# Patient Record
Sex: Female | Born: 1980 | Race: White | Hispanic: Yes | Marital: Married | Smoking: Never smoker
Health system: Southern US, Community
[De-identification: ages and names within clinical notes are randomized; demographics above are authoritative.]

## PROBLEM LIST (undated history)

## (undated) HISTORY — PX: BREAST SURGERY: SHX581

---

## 1999-07-07 ENCOUNTER — Ambulatory Visit (HOSPITAL_COMMUNITY): Admission: RE | Admit: 1999-07-07 | Discharge: 1999-07-07 | Payer: Self-pay | Admitting: Obstetrics

## 1999-09-08 ENCOUNTER — Ambulatory Visit (HOSPITAL_COMMUNITY): Admission: RE | Admit: 1999-09-08 | Discharge: 1999-09-08 | Payer: Self-pay | Admitting: *Deleted

## 1999-09-13 ENCOUNTER — Encounter (HOSPITAL_COMMUNITY): Admission: RE | Admit: 1999-09-13 | Discharge: 1999-11-24 | Payer: Self-pay | Admitting: Obstetrics & Gynecology

## 1999-10-05 ENCOUNTER — Encounter: Payer: Self-pay | Admitting: Obstetrics & Gynecology

## 1999-10-25 ENCOUNTER — Encounter: Payer: Self-pay | Admitting: Obstetrics & Gynecology

## 1999-11-08 ENCOUNTER — Encounter: Payer: Self-pay | Admitting: Obstetrics & Gynecology

## 1999-11-23 ENCOUNTER — Inpatient Hospital Stay (HOSPITAL_COMMUNITY): Admission: AD | Admit: 1999-11-23 | Discharge: 1999-11-29 | Payer: Self-pay | Admitting: *Deleted

## 1999-11-26 ENCOUNTER — Encounter: Payer: Self-pay | Admitting: Obstetrics & Gynecology

## 2001-03-29 ENCOUNTER — Encounter (HOSPITAL_BASED_OUTPATIENT_CLINIC_OR_DEPARTMENT_OTHER): Payer: Self-pay | Admitting: General Surgery

## 2001-03-29 ENCOUNTER — Encounter: Admission: RE | Admit: 2001-03-29 | Discharge: 2001-03-29 | Payer: Self-pay | Admitting: General Surgery

## 2001-04-27 ENCOUNTER — Encounter (HOSPITAL_BASED_OUTPATIENT_CLINIC_OR_DEPARTMENT_OTHER): Payer: Self-pay | Admitting: General Surgery

## 2001-05-01 ENCOUNTER — Encounter (INDEPENDENT_AMBULATORY_CARE_PROVIDER_SITE_OTHER): Payer: Self-pay | Admitting: *Deleted

## 2001-05-01 ENCOUNTER — Ambulatory Visit (HOSPITAL_COMMUNITY): Admission: RE | Admit: 2001-05-01 | Discharge: 2001-05-01 | Payer: Self-pay | Admitting: General Surgery

## 2003-08-15 ENCOUNTER — Other Ambulatory Visit: Admission: RE | Admit: 2003-08-15 | Discharge: 2003-08-15 | Payer: Self-pay | Admitting: Gynecology

## 2004-01-06 ENCOUNTER — Inpatient Hospital Stay (HOSPITAL_COMMUNITY): Admission: AD | Admit: 2004-01-06 | Discharge: 2004-01-08 | Payer: Self-pay | Admitting: Gynecology

## 2004-01-06 ENCOUNTER — Encounter (INDEPENDENT_AMBULATORY_CARE_PROVIDER_SITE_OTHER): Payer: Self-pay | Admitting: Specialist

## 2004-02-20 ENCOUNTER — Other Ambulatory Visit: Admission: RE | Admit: 2004-02-20 | Discharge: 2004-02-20 | Payer: Self-pay | Admitting: Gynecology

## 2008-05-14 ENCOUNTER — Ambulatory Visit: Payer: Self-pay | Admitting: Family Medicine

## 2008-06-10 ENCOUNTER — Ambulatory Visit: Payer: Self-pay | Admitting: Internal Medicine

## 2008-06-10 ENCOUNTER — Encounter: Payer: Self-pay | Admitting: Family Medicine

## 2008-06-10 LAB — CONVERTED CEMR LAB
ALT: 20 units/L (ref 0–35)
AST: 16 units/L (ref 0–37)
Albumin: 4.5 g/dL (ref 3.5–5.2)
Alkaline Phosphatase: 69 units/L (ref 39–117)
BUN: 9 mg/dL (ref 6–23)
Basophils Absolute: 0 10*3/uL (ref 0.0–0.1)
Basophils Relative: 1 % (ref 0–1)
CO2: 24 meq/L (ref 19–32)
Calcium: 9.4 mg/dL (ref 8.4–10.5)
Chloride: 106 meq/L (ref 96–112)
Cholesterol: 188 mg/dL (ref 0–200)
Creatinine, Ser: 0.46 mg/dL (ref 0.40–1.20)
Eosinophils Absolute: 0.2 10*3/uL (ref 0.0–0.7)
Eosinophils Relative: 3 % (ref 0–5)
FSH: 5.4 milliintl units/mL
Glucose, Bld: 89 mg/dL (ref 70–99)
HCT: 43.6 % (ref 36.0–46.0)
HDL: 57 mg/dL (ref >39)
Hemoglobin: 14.3 g/dL (ref 12.0–15.0)
LDL Cholesterol: 110 mg/dL — ABNORMAL HIGH (ref 0–99)
Lymphocytes Relative: 37 % (ref 12–46)
Lymphs Abs: 2.3 10*3/uL (ref 0.7–4.0)
MCHC: 32.8 g/dL (ref 30.0–36.0)
MCV: 90.1 fL (ref 78.0–100.0)
Monocytes Absolute: 0.4 10*3/uL (ref 0.1–1.0)
Monocytes Relative: 7 % (ref 3–12)
Neutro Abs: 3.2 10*3/uL (ref 1.7–7.7)
Neutrophils Relative %: 52 % (ref 43–77)
Platelets: 260 10*3/uL (ref 150–400)
Potassium: 4.2 meq/L (ref 3.5–5.3)
RBC: 4.84 M/uL (ref 3.87–5.11)
RDW: 13.1 % (ref 11.5–15.5)
Sodium: 140 meq/L (ref 135–145)
TSH: 0.527 microintl units/mL (ref 0.350–4.50)
Total Bilirubin: 0.7 mg/dL (ref 0.3–1.2)
Total CHOL/HDL Ratio: 3.3
Total Protein: 7 g/dL (ref 6.0–8.3)
Triglycerides: 106 mg/dL (ref <150)
VLDL: 21 mg/dL (ref 0–40)
WBC: 6.1 10*3/uL (ref 4.0–10.5)

## 2009-01-23 ENCOUNTER — Ambulatory Visit: Payer: Self-pay | Admitting: Internal Medicine

## 2009-01-26 ENCOUNTER — Ambulatory Visit: Payer: Self-pay | Admitting: *Deleted

## 2009-03-09 ENCOUNTER — Ambulatory Visit: Payer: Self-pay | Admitting: Internal Medicine

## 2009-03-09 ENCOUNTER — Encounter: Payer: Self-pay | Admitting: Family Medicine

## 2009-04-14 ENCOUNTER — Ambulatory Visit: Payer: Self-pay | Admitting: Internal Medicine

## 2010-04-02 ENCOUNTER — Ambulatory Visit: Payer: Self-pay | Admitting: Internal Medicine

## 2011-02-18 NOTE — Op Note (Signed)
[facility]  Patient:    Julia Collier                     MRN: [id number] Proc. Date: 05/01/01 Adm. Date:  [id number] Disc. Date: [id number] Attending:  Sonda Primes                           Operative Report  PREOPERATIVE DIAGNOSIS:  Right breast mass, rule out carcinoma.  POSTOPERATIVE DIAGNOSIS:  Right breast mass, rule out carcinoma, pathology pending.  OPERATION PERFORMED:  Excisional biopsy, right breast mass.  SURGEON:  Mardene Celeste. Lurene Shadow, M.D.  ASSISTANT:  Nurse.  ANESTHESIA:  General.  INDICATIONS FOR PROCEDURE:  The patient is a 30 year old Hispanic woman who presents with a mass in the right breast which on ultrasound shows an irregular and poorly circumscribed mass.  She is brought to the operating room now for excisional biopsy to rule out carcinoma.  DESCRIPTION OF PROCEDURE:  Following the induction of general anesthesia with the patient positioned supinely, the right breast was prepped and draped to be included in the sterile operative field.  The mass was located in the lower outer quadrant approximately midway between the areolar border and the inferior mammary line.  I chose to do an inferior mammary incision and deepened this through the skin and subcutaneous tissue carrying the dissection superiorly and medially toward the mass.  The mass was then grasped and sharply dissected free, removed and forwarded for pathologic evaluation. Hemostasis was obtained with electrocautery.  Breast tissues reapproximated with 2-0 Vicryls, subcutaneous tissues closed with 3-0 Vicryl interrupted sutures after sponge, instrument and sharp counts were verified.  Skin closed with 5-0 Monocryl and reinforced wiht Steri-Strips and sterile dressing applied.  Anesthetic reversed.  Patient removed from the operating room to the recovery room in stable condition having tolerated the procedure well. DD:  05/01/01 TD:  05/01/01 Job:  [id number]

## 2011-02-18 NOTE — Discharge Summary (Signed)
Julia Collier, Julia Collier                         ACCOUNT NO.:  [id number]   MEDICAL RECORD NO.:  [id number]                   PATIENT TYPE:  INP   LOCATION:  9137                                 FACILITY:  [facility]   PHYSICIAN:  Ivor Costa. Farrel Gobble, M.D.              DATE OF BIRTH:  04-19-1981   DATE OF ADMISSION:  01/06/2004  DATE OF DISCHARGE:  01/08/2004                                 DISCHARGE SUMMARY   DISCHARGE DIAGNOSES:  1. Intrauterine pregnancy at 39 weeks, delivered.  2. Positive group B streptococcus.  3. Status post spontaneous vaginal delivery.   HISTORY:  A 22-years-of-age gravida 2 para 1 with an EDC by ultrasound of  January 29, 2004.  Prenatal course had been complicated by history of  oligohydramnios with her first pregnancy delivered at 36 weeks, with  positive group B strep.  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   HOSPITAL COURSE:  On January 06, 2004 the patient presented at 39+ weeks in  prodromal labor.  Labor was augmented with Pitocin.  Artificial rupture of  membranes was performed and subsequently on January 06, 2004 the patient  underwent a spontaneous vaginal delivery of a female, Apgars of 9 and 9,  weight 8 pounds 6 ounces.  There was no episiotomy or laceration.  It was  noted to be increased bleeding initially which was treated with massage and  increased Pitocin which resolved the problem.  Postpartum the patient  remained afebrile, voiding, in stable condition, and she was discharged to  home on January 08, 2004 and given [facility] postpartum  instructions and postpartum booklet.   ACCESSORY CLINICAL FINDINGS/LABORATORY DATA:  The patient is O positive,  rubella immune.  On January 07, 2004 hemoglobin 11.3.   DISPOSITION:  The patient is discharged to home, informed to follow up in 6  weeks, if had any problem  prior to that time to be seen in the office.     Susa Loffler, P.A.                    Ivor Costa. Farrel Gobble, M.D.    TSG/MEDQ  D:  01/19/2004  T:  01/19/2004  Job:  [id number]

## 2011-10-24 ENCOUNTER — Other Ambulatory Visit: Payer: Self-pay | Admitting: Family Medicine

## 2013-08-27 ENCOUNTER — Ambulatory Visit (INDEPENDENT_AMBULATORY_CARE_PROVIDER_SITE_OTHER): Payer: Self-pay | Admitting: Emergency Medicine

## 2013-08-27 VITALS — BP 122/70 | HR 78 | Temp 99.2°F | Resp 16 | Ht 62.0 in | Wt 141.0 lb

## 2013-08-27 DIAGNOSIS — N644 Mastodynia: Secondary | ICD-10-CM

## 2013-08-27 NOTE — Patient Instructions (Signed)
If you are not contacted with a time for your ultrasound call back and we will confirm the time, however you should receive a call with an appointment time in the next 24 hours.

## 2013-08-27 NOTE — Progress Notes (Addendum)
  Subjective:    Patient ID: Julia Collier, female    DOB: 15-Nov-1980, 32 y.o.   MRN: [id number]  HPI 32 yo female with complaint of left breast mass/lump that is tender.  Onset over past 2 weeks.  No fever.  No injury.  Not nursing.  She has not taken anything for this at present.  Pain on left upper outer breast.  PPMH:  Right breast abscess which was drained in 2002.  SH:  Non smoker.  Review of Systems  Constitutional: Negative for fever and chills.  Respiratory: Negative for cough and shortness of breath.   Cardiovascular: Negative for chest pain and palpitations.  Gastrointestinal: Negative for nausea, vomiting, abdominal pain, diarrhea and constipation.  Genitourinary: Negative for dysuria and frequency.  Skin: Negative for color change and rash.  Neurological: Negative for light-headedness and headaches.       Objective:   Physical Exam Blood pressure 122/70, pulse 78, temperature 99.2 F (37.3 C), temperature source Oral, resp. rate 16, height 5\' 2"  (1.575 m), weight 141 lb (63.957 kg), last menstrual period 07/31/2013, SpO2 99.00%. Body mass index is 25.78 kg/(m^2). Well-developed, well nourished female who is awake, alert and oriented, in NAD. HEENT: [place]/AT, PERRL, EOMI.  Sclera and conjunctiva are clear. OP is clear. Neck: supple, non-tender, no lymphadenopathy, thyromegaly. Heart: RRR, no murmur Lungs: normal effort, CTA Breat exam:  Mildly tender small firm lump in upper outer quadrant of left breast.  No erythema or fluctuance.  No overlying skin changes.  No axillary adenopathy palpable. Abdomen: normo-active bowel sounds, supple, non-tender, no mass or organomegaly. Extremities: no cyanosis, clubbing or edema. Skin: warm and dry without rash. Psychologic: good mood and appropriate affect, normal speech and behavior.       Assessment & Plan:  Breast mass vs. Cyst. [place] ordered and will follow up on the results.   I have reviewed and agree with  documentation. Robert P. Merla Riches, M.D.

## 2013-09-04 ENCOUNTER — Telehealth: Payer: Self-pay

## 2013-09-04 NOTE — Telephone Encounter (Signed)
Patient called to ask about [place] referral. Don't see any notes in chart about appointment. It has been several days and patient is anxious to get it scheduled. Please call back as soon as possible (per patient). Spanish speaking only.

## 2013-09-05 NOTE — Telephone Encounter (Signed)
Can you check with the breast center on the status of the U/S?

## 2013-09-23 ENCOUNTER — Other Ambulatory Visit (HOSPITAL_COMMUNITY): Payer: Self-pay | Admitting: *Deleted

## 2013-09-23 DIAGNOSIS — N6320 Unspecified lump in the left breast, unspecified quadrant: Secondary | ICD-10-CM

## 2013-10-01 ENCOUNTER — Ambulatory Visit (HOSPITAL_COMMUNITY)
Admission: RE | Admit: 2013-10-01 | Discharge: 2013-10-01 | Disposition: A | Discharge status: Home / Self Care | Payer: Medicaid Other | Source: Ambulatory Visit | Attending: Obstetrics and Gynecology | Admitting: Obstetrics and Gynecology

## 2013-10-01 ENCOUNTER — Encounter (HOSPITAL_COMMUNITY): Payer: Self-pay

## 2013-10-01 ENCOUNTER — Encounter (INDEPENDENT_AMBULATORY_CARE_PROVIDER_SITE_OTHER): Payer: Self-pay

## 2013-10-01 VITALS — BP 110/68 | Temp 98.0°F | Ht 62.0 in | Wt 143.2 lb

## 2013-10-01 DIAGNOSIS — Z1239 Encounter for other screening for malignant neoplasm of breast: Secondary | ICD-10-CM

## 2013-10-01 DIAGNOSIS — N6321 Unspecified lump in the left breast, upper outer quadrant: Secondary | ICD-10-CM

## 2013-10-01 DIAGNOSIS — N6315 Unspecified lump in the right breast, overlapping quadrants: Secondary | ICD-10-CM | POA: Insufficient documentation

## 2013-10-01 NOTE — Progress Notes (Signed)
Complaints of left breast lump x 4 months that is painful. Patient rated pain at a 8 out of 10.  Pap Smear:  Pap smear not completed today. Last Pap smear was 10/24/2011 at [facility] and normal. Per patient has no history of an abnormal Pap smear. Last Pap smear result is in EPIC.  Physical exam: Breasts Breasts symmetrical. No skin abnormalities bilateral breasts. No nipple retraction bilateral breasts. No nipple discharge bilateral breasts. No lymphadenopathy. Palpated a lump within the left breast at 2 o'clock 4 cm from the nipple. Palpated a lump within the right breast at 6 o'clock 3 cm from the nipple. Patient complained of tenderness when palpated both lumps. Referred patient to the [facility] for bilateral breast ultrasounds. Appointment scheduled for Friday, October 04, 2013 at 1330. Patient is currently around [redacted] weeks pregnant per patient.      Pelvic/Bimanual No Pap smear completed today since last Pap smear was 10/24/2011. Pap smear not indicated per BCCCP guidelines.

## 2013-10-01 NOTE — Patient Instructions (Signed)
Taught Julia Collier how to perform BSE and gave educational materials to take home. Patient did not need a Pap smear today due to last Pap smear was 10/24/2011. Let her know BCCCP will cover Pap smears every 3 years unless has a history of abnormal Pap smears. Referred patient to the [facility] for bilateral breast ultrasounds. Appointment scheduled for Friday, October 04, 2013 at 1330. Patient aware of appointment and will be there. Julia Collier verbalized understanding.  Lamyra Malcolm, Kathaleen Maser, RN 11:37 AM

## 2013-10-02 ENCOUNTER — Other Ambulatory Visit (HOSPITAL_COMMUNITY): Payer: Self-pay | Admitting: *Deleted

## 2013-10-02 DIAGNOSIS — N6310 Unspecified lump in the right breast, unspecified quadrant: Secondary | ICD-10-CM

## 2013-10-02 DIAGNOSIS — N6452 Nipple discharge: Secondary | ICD-10-CM

## 2013-10-03 NOTE — L&D Delivery Note (Signed)
Delivery Note  PRE-OPERATIVE DIAGNOSIS:  1) 4050w5d pregnancy.   POST-OPERATIVE DIAGNOSIS:  1) 8550w5d pregnancy s/p Vaginal, Spontaneous Delivery   Delivery Type: Vaginal, Spontaneous Delivery   Delivery Clinician: Grayson Pfefferle   Delivery Anesthesia: Epidural   Labor Complications: None  Lacerations: None  ESTIMATED BLOOD LOSS:  350 cc  Labor course: This is a 33 y.o. y.o. female 703P2002  who came in at 2450w5d pregnancy complaining of LOF.  Her prenatal course was complicated by none.  Initial cervical exam was 2/50/-3.  She was admitted to L and D.  Labor course included:  Starting on Pitocin Epidural placement   Procedure: Vaginal, Spontaneous Delivery    Date of birth: 05/13/2014   Time of birth: 5:28 AM    This [id number] woman under epidural anesthesia delivered a viable female  infant with Apgars as listed below.  Delivery was via NSVD.  Delivery completed and cord cut and clamped. Infant dried and stimulated. Baby to maternal abdomoen. Cord pH not obtained. Active management of the third stage of labor performed. Intact placenta delivered spontaneously at 8/11  5:33 AM . Vagina and cervix explored and no lacerations noted. Uterus well contracted at end of delivery.  Mother and infant tolerated delivery well.    FINDINGS:   1) female infant, Apgar scores of   at 1 minute   at 5 minutes   2) 3 Vessel Cord  3) Nuchal: no  SPECIMENS: Placenta Discarded; Cord gases not sent  COMPLICATIONS: none  DISPOSITION:  Infant to NBN

## 2013-10-04 ENCOUNTER — Ambulatory Visit
Admission: RE | Admit: 2013-10-04 | Discharge: 2013-10-04 | Disposition: A | Discharge status: Home / Self Care | Payer: No Typology Code available for payment source | Source: Ambulatory Visit | Attending: Obstetrics and Gynecology | Admitting: Obstetrics and Gynecology

## 2013-10-04 ENCOUNTER — Other Ambulatory Visit (HOSPITAL_COMMUNITY): Payer: Self-pay | Admitting: Obstetrics and Gynecology

## 2013-10-04 DIAGNOSIS — N6320 Unspecified lump in the left breast, unspecified quadrant: Secondary | ICD-10-CM

## 2013-11-07 ENCOUNTER — Other Ambulatory Visit (HOSPITAL_COMMUNITY): Payer: Self-pay | Admitting: Nurse Practitioner

## 2013-11-07 DIAGNOSIS — Z3689 Encounter for other specified antenatal screening: Secondary | ICD-10-CM

## 2013-12-19 ENCOUNTER — Ambulatory Visit (HOSPITAL_COMMUNITY)
Admission: RE | Admit: 2013-12-19 | Discharge: 2013-12-19 | Disposition: A | Discharge status: Home / Self Care | Payer: Medicaid Other | Source: Ambulatory Visit | Attending: Nurse Practitioner | Admitting: Nurse Practitioner

## 2013-12-19 DIAGNOSIS — Z3689 Encounter for other specified antenatal screening: Secondary | ICD-10-CM | POA: Insufficient documentation

## 2014-01-10 LAB — OB RESULTS CONSOLE GC/CHLAMYDIA
Chlamydia: NEGATIVE
Gonorrhea: NEGATIVE

## 2014-01-10 LAB — OB RESULTS CONSOLE HEPATITIS B SURFACE ANTIGEN: Hepatitis B Surface Ag: NEGATIVE

## 2014-04-14 LAB — OB RESULTS CONSOLE GBS: STREP GROUP B AG: NEGATIVE

## 2014-05-12 ENCOUNTER — Inpatient Hospital Stay (HOSPITAL_COMMUNITY)
Admission: AD | Admit: 2014-05-12 | Discharge: 2014-05-14 | DRG: 775 | Disposition: A | Discharge status: Home / Self Care | Payer: Medicaid Other | Source: Ambulatory Visit | Attending: Obstetrics & Gynecology | Admitting: Obstetrics & Gynecology

## 2014-05-12 ENCOUNTER — Encounter (HOSPITAL_COMMUNITY): Payer: Self-pay | Admitting: *Deleted

## 2014-05-12 DIAGNOSIS — Z833 Family history of diabetes mellitus: Secondary | ICD-10-CM

## 2014-05-12 DIAGNOSIS — O4290 Premature rupture of membranes, unspecified as to length of time between rupture and onset of labor, unspecified weeks of gestation: Principal | ICD-10-CM | POA: Diagnosis present

## 2014-05-12 LAB — OB RESULTS CONSOLE ABO/RH: RH Type: POSITIVE

## 2014-05-12 LAB — RPR

## 2014-05-12 LAB — CBC
HEMATOCRIT: 39.3 % (ref 36.0–46.0)
HEMOGLOBIN: 14.2 g/dL (ref 12.0–15.0)
MCH: 33.2 pg (ref 26.0–34.0)
MCHC: 36.1 g/dL — ABNORMAL HIGH (ref 30.0–36.0)
MCV: 91.8 fL (ref 78.0–100.0)
Platelets: 193 10*3/uL (ref 150–400)
RBC: 4.28 MIL/uL (ref 3.87–5.11)
RDW: 12.7 % (ref 11.5–15.5)
WBC: 8.6 10*3/uL (ref 4.0–10.5)

## 2014-05-12 LAB — OB RESULTS CONSOLE ANTIBODY SCREEN: Antibody Screen: NEGATIVE

## 2014-05-12 LAB — OB RESULTS CONSOLE RUBELLA ANTIBODY, IGM: Rubella: IMMUNE

## 2014-05-12 LAB — AMNISURE RUPTURE OF MEMBRANE (ROM) NOT AT ARMC: Amnisure ROM: POSITIVE

## 2014-05-12 LAB — OB RESULTS CONSOLE RPR: RPR: NONREACTIVE

## 2014-05-12 LAB — OB RESULTS CONSOLE HIV ANTIBODY (ROUTINE TESTING): HIV: NONREACTIVE

## 2014-05-12 LAB — TYPE AND SCREEN
ABO/RH(D): O POS
ANTIBODY SCREEN: NEGATIVE

## 2014-05-12 LAB — ABO/RH: ABO/RH(D): O POS

## 2014-05-12 MED ORDER — LACTATED RINGERS IV SOLN
INTRAVENOUS | Status: DC
  Administered 2014-05-12: 15:00:00 via INTRAVENOUS

## 2014-05-12 MED ORDER — PHENYLEPHRINE 40 MCG/ML (10ML) SYRINGE FOR IV PUSH (FOR BLOOD PRESSURE SUPPORT)
80.0000 ug | PREFILLED_SYRINGE | INTRAVENOUS | Status: DC | PRN
  Filled 2014-05-12: qty 2

## 2014-05-12 MED ORDER — OXYTOCIN 40 UNITS IN LACTATED RINGERS INFUSION - SIMPLE MED: 62.5000 mL/h | INTRAVENOUS | Status: DC

## 2014-05-12 MED ORDER — LACTATED RINGERS IV SOLN
INTRAVENOUS | Status: DC
  Administered 2014-05-12: 21:00:00 via INTRAVENOUS

## 2014-05-12 MED ORDER — LACTATED RINGERS IV SOLN
500.0000 mL | Freq: Once | INTRAVENOUS | Status: AC
  Administered 2014-05-13: 500 mL via INTRAVENOUS

## 2014-05-12 MED ORDER — DIPHENHYDRAMINE HCL 50 MG/ML IJ SOLN: 12.5000 mg | INTRAMUSCULAR | Status: DC | PRN

## 2014-05-12 MED ORDER — IBUPROFEN 600 MG PO TABS
600.0000 mg | ORAL_TABLET | Freq: Four times a day (QID) | ORAL | Status: DC | PRN
  Filled 2014-05-12: qty 1

## 2014-05-12 MED ORDER — LACTATED RINGERS IV SOLN: 500.0000 mL | INTRAVENOUS | Status: DC | PRN

## 2014-05-12 MED ORDER — FLEET ENEMA 7-19 GM/118ML RE ENEM: 1.0000 | ENEMA | RECTAL | Status: DC | PRN

## 2014-05-12 MED ORDER — OXYTOCIN 40 UNITS IN LACTATED RINGERS INFUSION - SIMPLE MED
62.5000 mL/h | INTRAVENOUS | Status: DC
  Filled 2014-05-12: qty 1000

## 2014-05-12 MED ORDER — EPHEDRINE 5 MG/ML INJ
10.0000 mg | INTRAVENOUS | Status: DC | PRN
  Filled 2014-05-12: qty 2

## 2014-05-12 MED ORDER — IBUPROFEN 600 MG PO TABS
600.0000 mg | ORAL_TABLET | Freq: Four times a day (QID) | ORAL | Status: DC | PRN
  Administered 2014-05-13: 600 mg via ORAL

## 2014-05-12 MED ORDER — OXYTOCIN BOLUS FROM INFUSION
500.0000 mL | INTRAVENOUS | Status: DC
  Administered 2014-05-13: 500 mL via INTRAVENOUS

## 2014-05-12 MED ORDER — FENTANYL 2.5 MCG/ML BUPIVACAINE 1/10 % EPIDURAL INFUSION (WH - ANES)
14.0000 mL/h | INTRAMUSCULAR | Status: DC | PRN
  Administered 2014-05-13: 14 mL/h via EPIDURAL
  Filled 2014-05-12: qty 125

## 2014-05-12 MED ORDER — OXYTOCIN 40 UNITS IN LACTATED RINGERS INFUSION - SIMPLE MED
1.0000 m[IU]/min | INTRAVENOUS | Status: DC
  Administered 2014-05-12: 2 m[IU]/min via INTRAVENOUS

## 2014-05-12 MED ORDER — ONDANSETRON HCL 4 MG/2ML IJ SOLN: 4.0000 mg | Freq: Four times a day (QID) | INTRAMUSCULAR | Status: DC | PRN

## 2014-05-12 MED ORDER — LIDOCAINE HCL (PF) 1 % IJ SOLN
30.0000 mL | INTRAMUSCULAR | Status: DC | PRN
  Filled 2014-05-12: qty 30

## 2014-05-12 MED ORDER — ACETAMINOPHEN 325 MG PO TABS: 650.0000 mg | ORAL_TABLET | ORAL | Status: DC | PRN

## 2014-05-12 MED ORDER — OXYCODONE-ACETAMINOPHEN 5-325 MG PO TABS: 1.0000 | ORAL_TABLET | ORAL | Status: DC | PRN

## 2014-05-12 MED ORDER — OXYCODONE-ACETAMINOPHEN 5-325 MG PO TABS
1.0000 | ORAL_TABLET | ORAL | Status: DC | PRN
  Administered 2014-05-13: 2 via ORAL
  Filled 2014-05-12: qty 2

## 2014-05-12 MED ORDER — TERBUTALINE SULFATE 1 MG/ML IJ SOLN: 0.2500 mg | Freq: Once | INTRAMUSCULAR | Status: AC | PRN

## 2014-05-12 MED ORDER — OXYTOCIN BOLUS FROM INFUSION: 500.0000 mL | INTRAVENOUS | Status: DC

## 2014-05-12 MED ORDER — ACETAMINOPHEN 325 MG PO TABS
650.0000 mg | ORAL_TABLET | ORAL | Status: DC | PRN
Start: 2014-05-12 — End: 2014-05-13

## 2014-05-12 MED ORDER — PRENATAL MULTIVITAMIN CH
1.0000 | ORAL_TABLET | Freq: Every day | ORAL | Status: DC
  Filled 2014-05-12: qty 1

## 2014-05-12 MED ORDER — CITRIC ACID-SODIUM CITRATE 334-500 MG/5ML PO SOLN: 30.0000 mL | ORAL | Status: DC | PRN

## 2014-05-12 MED ORDER — PHENYLEPHRINE 40 MCG/ML (10ML) SYRINGE FOR IV PUSH (FOR BLOOD PRESSURE SUPPORT)
80.0000 ug | PREFILLED_SYRINGE | INTRAVENOUS | Status: DC | PRN
  Filled 2014-05-12: qty 10
  Filled 2014-05-12: qty 2

## 2014-05-12 MED ORDER — CITRIC ACID-SODIUM CITRATE 334-500 MG/5ML PO SOLN
30.0000 mL | ORAL | Status: DC | PRN
Start: 2014-05-12 — End: 2014-05-13

## 2014-05-12 NOTE — Progress Notes (Signed)
Patient ID: Julia Collier, female   DOB: [birth year], 33 y.o.   MRN: [id number]  Labor Progress Note  ASSESSMENT:   Julia Collier 33 y.o. G3P2002 at [redacted]w[redacted]d in induced labor for PROM   PLAN:  1) Labor curve reviewed.       Progress: Appropriate             Plan: Continue pitocin, increase per protocol  2) Fetal heart tracing reviewed.    Cat I, reassuring  3) GBS Status - neg No results found for this basename: STREPBCULT    4) Other Problems Active Problems:   PROM (premature rupture of membranes)   ROM (rupture of membranes), premature   SUBJECTIVE:  States contractions are painful, 7/10. Declines epidural at this time.  OBJECTIVE:  Vital Signs:  Patient Vitals for the past 2 hrs:  BP Temp Temp src Pulse Resp  05/12/14 2208 118/83 mmHg 98.9 F (37.2 C) Oral 60 18   SVE: Dilation: 3, Effacement (%): 50, Station: -2  FHR Monitoring Baseline Rate (A): 135 bpm   Accelerations: 15 x 15 Contraction Frequency (min): 2-4

## 2014-05-12 NOTE — MAU Note (Signed)
Pt states she has been having uc's since 0730, went to bathroom & had ? Leaking of fluid twice this morning, denies leaking now.  No bleeding.  3rd baby, no C/S's.

## 2014-05-12 NOTE — H&P (Signed)
LABOR ADMISSION HISTORY AND PHYSICAL  Julia Collier is a 33 y.o. female G3P2002 with IUP at [redacted]w[redacted]d by LMP presenting for PROM at 7:30AM. Pt presents w/ gush of fluid x 2 at 7:30 AM. She reports +FMs, No LOF, no VB, no blurry vision, headaches or peripheral edema, and RUQ pain. She was having contractions every 15 minutes since ~ 12:30 last PM, but reports the contractions have now subsided. She is unsure if she desires an epidural for labor pain control. She plans on breast feeding. She unsure what she wants for birth control.    Dating: By LMP --->  Estimated Date of Delivery: 05/08/14  Sono: Normal anatomy scan  Prenatal History/Complications: Varicose veins  Past Medical History: History reviewed. No pertinent past medical history.  Past Surgical History: Past Surgical History  Procedure Laterality Date  . Breast surgery Right     non malignant abscess    Obstetrical History: OB History   Grav Para Term Preterm Abortions TAB SAB Ect Mult Living   3 2 2       2        Gynecological History: noncontributory   Social History: History   Social History  . Marital Status: Married    Spouse Name: N/A    Number of Children: N/A  . Years of Education: N/A   Social History Main Topics  . Smoking status: Never Smoker   . Smokeless tobacco: None  . Alcohol Use: No  . Drug Use: No  . Sexual Activity: Yes   Other Topics Concern  . None   Social History Narrative  . None    Family History: Family History  Problem Relation Age of Onset  . Diabetes Mother   . Hyperlipidemia Father     Allergies: No Known Allergies  Prescriptions prior to admission  Medication Sig Dispense Refill  . Prenatal Vit-Fe Fumarate-FA (PRENATAL MULTIVITAMIN) TABS tablet Take 1 tablet by mouth daily at 12 noon.         Review of Systems   All systems reviewed and negative except as stated in HPI  Blood pressure 132/79, pulse 86, temperature 98.4 F (36.9 C), temperature  source Oral, resp. rate 18, height 5\' 1"  (1.549 m), weight 170 lb (77.111 kg), last menstrual period 07/31/2013. General appearance: alert and cooperative Lungs: clear to auscultation bilaterally Heart: regular rate and rhythm Abdomen: soft, non-tender; bowel sounds normal Pelvic: Adequate, small amount of fluid Extremities: Homans sign is negative, no sign of DVT Presentation: cephalic Fetal monitoringBaseline: 135 bpm Uterine activityFrequency: Every 15 minutes Dilation: 2 Effacement (%): 50 Exam by:: Ethelda Chick, MD    Prenatal labs: ABO, Rh:  O+ Antibody:  Neg Rubella:  Immune RPR:   Neg  HBsAg:   Neg HIV:   Neg GBS:   Neg 1 hr Glucola - 147 --> 3 hr gtt WNL Genetic screening  WNL (quad) Anatomy US WNL   Prenatal Transfer Tool  Maternal Diabetes: No Genetic Screening: Normal Maternal Ultrasounds/Referrals: Normal Fetal Ultrasounds or other Referrals:  None Maternal Substance Abuse:  No Significant Maternal Medications:  None Significant Maternal Lab Results: Lab values include: Group B Strep negative     Results for orders placed during the hospital encounter of 05/12/14 (from the past 24 hour(s))  AMNISURE RUPTURE OF MEMBRANE (ROM)   Collection Time    05/12/14  2:12 PM      Result Value Ref Range   Amnisure ROM POSITIVE      Patient Active Problem List  Diagnosis Date Noted  . Breast lump on left side at 2 o'clock position 10/01/2013  . Breast lump on right side at 6 o'clock position 10/01/2013    Assessment: Julia Collier is a 33 y.o. G3P2002 at 5724w4d here for PROM on 8/10 @ 0730 w/ Cat I strip.  #Labor: Will place FB and start pitocin per pt's request #Pain: Pt unsure re: epidural #FWB: Cat I, reassuring #ID:  GBS neg #MOF: Breast #MOC: Undecided #Circ:  Declines  Elita BooneRoberts, Melonee Gerstel C 05/12/2014, 2:49 PM

## 2014-05-12 NOTE — Progress Notes (Signed)
Patient ID: Julia Collier, female   DOB: [birth year], 33 y.o.   MRN: [id number]  Labor Progress Note  ASSESSMENT:   Julia Collier 33 y.o. G3P2002 at 10422w4d in induced labor for PROM   PLAN:  1) Labor curve reviewed.       Progress: Appropriate             Plan: Continue pitocin, increase per protocol  2) Fetal heart tracing reviewed.    Cat I, reassuring  3) GBS Status - neg No results found for this basename: STREPBCULT    4) Other Problems Active Problems:   PROM (premature rupture of membranes)   ROM (rupture of membranes), premature   SUBJECTIVE:  Starting to feel more painful contractions.   OBJECTIVE:  Vital Signs: Patient Vitals for the past 2 hrs:  BP Temp Temp src Pulse Resp  05/12/14 1930 130/76 mmHg 98.8 F (37.1 C) Oral 64 16  05/12/14 1848 123/83 mmHg - - 67 16   SVE: Dilation: 2.5, Effacement (%): 80, Station: -2  FHR Monitoring Baseline Rate (A): 140 bpm   Accelerations: 15 x 15 Contraction Frequency (min): 2-4

## 2014-05-13 ENCOUNTER — Inpatient Hospital Stay (HOSPITAL_COMMUNITY): Payer: Medicaid Other | Admitting: Anesthesiology

## 2014-05-13 ENCOUNTER — Encounter (HOSPITAL_COMMUNITY): Payer: Self-pay | Admitting: General Practice

## 2014-05-13 ENCOUNTER — Encounter (HOSPITAL_COMMUNITY): Payer: Medicaid Other | Admitting: Anesthesiology

## 2014-05-13 DIAGNOSIS — Z833 Family history of diabetes mellitus: Secondary | ICD-10-CM

## 2014-05-13 DIAGNOSIS — O4290 Premature rupture of membranes, unspecified as to length of time between rupture and onset of labor, unspecified weeks of gestation: Secondary | ICD-10-CM

## 2014-05-13 MED ORDER — ONDANSETRON HCL 4 MG/2ML IJ SOLN: 4.0000 mg | INTRAMUSCULAR | Status: DC | PRN

## 2014-05-13 MED ORDER — ZOLPIDEM TARTRATE 5 MG PO TABS: 5.0000 mg | ORAL_TABLET | Freq: Every evening | ORAL | Status: DC | PRN

## 2014-05-13 MED ORDER — WITCH HAZEL-GLYCERIN EX PADS: 1.0000 "application " | MEDICATED_PAD | CUTANEOUS | Status: DC | PRN

## 2014-05-13 MED ORDER — SENNOSIDES-DOCUSATE SODIUM 8.6-50 MG PO TABS
2.0000 | ORAL_TABLET | ORAL | Status: DC
  Administered 2014-05-13: 2 via ORAL
  Filled 2014-05-13: qty 2

## 2014-05-13 MED ORDER — ONDANSETRON HCL 4 MG PO TABS: 4.0000 mg | ORAL_TABLET | ORAL | Status: DC | PRN

## 2014-05-13 MED ORDER — DIPHENHYDRAMINE HCL 25 MG PO CAPS: 25.0000 mg | ORAL_CAPSULE | Freq: Four times a day (QID) | ORAL | Status: DC | PRN

## 2014-05-13 MED ORDER — DIBUCAINE 1 % RE OINT: 1.0000 "application " | TOPICAL_OINTMENT | RECTAL | Status: DC | PRN

## 2014-05-13 MED ORDER — FENTANYL CITRATE 0.05 MG/ML IJ SOLN
100.0000 ug | Freq: Once | INTRAMUSCULAR | Status: AC
  Administered 2014-05-13: 100 ug via INTRAVENOUS
  Filled 2014-05-13: qty 2

## 2014-05-13 MED ORDER — LANOLIN HYDROUS EX OINT: TOPICAL_OINTMENT | CUTANEOUS | Status: DC | PRN

## 2014-05-13 MED ORDER — FENTANYL CITRATE 0.05 MG/ML IJ SOLN
100.0000 ug | Freq: Once | INTRAMUSCULAR | Status: DC
  Filled 2014-05-13: qty 2

## 2014-05-13 MED ORDER — PRENATAL MULTIVITAMIN CH
1.0000 | ORAL_TABLET | Freq: Every day | ORAL | Status: DC
  Administered 2014-05-13 – 2014-05-14 (×2): 1 via ORAL
  Filled 2014-05-13 (×2): qty 1

## 2014-05-13 MED ORDER — TETANUS-DIPHTH-ACELL PERTUSSIS 5-2.5-18.5 LF-MCG/0.5 IM SUSP: 0.5000 mL | Freq: Once | INTRAMUSCULAR | Status: DC

## 2014-05-13 MED ORDER — OXYCODONE-ACETAMINOPHEN 5-325 MG PO TABS: 1.0000 | ORAL_TABLET | ORAL | Status: DC | PRN

## 2014-05-13 MED ORDER — BENZOCAINE-MENTHOL 20-0.5 % EX AERO: 1.0000 "application " | INHALATION_SPRAY | CUTANEOUS | Status: DC | PRN

## 2014-05-13 MED ORDER — LIDOCAINE HCL (PF) 1 % IJ SOLN
INTRAMUSCULAR | Status: DC | PRN
  Administered 2014-05-13 (×4): 4 mL

## 2014-05-13 MED ORDER — SIMETHICONE 80 MG PO CHEW: 80.0000 mg | CHEWABLE_TABLET | ORAL | Status: DC | PRN

## 2014-05-13 MED ORDER — IBUPROFEN 600 MG PO TABS
600.0000 mg | ORAL_TABLET | Freq: Four times a day (QID) | ORAL | Status: DC
  Administered 2014-05-13 – 2014-05-14 (×5): 600 mg via ORAL
  Filled 2014-05-13 (×5): qty 1

## 2014-05-13 NOTE — Anesthesia Procedure Notes (Signed)
Epidural Patient location during procedure: OB Start time: 05/13/2014 2:56 AM  Staffing Performed by: anesthesiologist   Preanesthetic Checklist Completed: patient identified, site marked, surgical consent, pre-op evaluation, timeout performed, IV checked, risks and benefits discussed and monitors and equipment checked  Epidural Patient position: sitting Prep: site prepped and draped and DuraPrep Patient monitoring: continuous pulse ox and blood pressure Approach: midline Location: L3-L4 Injection technique: LOR air  Needle:  Needle type: Tuohy  Needle gauge: 17 G Needle length: 9 cm and 9 Needle insertion depth: 5 cm cm Catheter type: closed end flexible Catheter size: 19 Gauge Catheter at skin depth: 10 cm Test dose: negative  Assessment Events: blood not aspirated, injection not painful, no injection resistance, negative IV test and no paresthesia  Additional Notes Discussed risk of headache, infection, bleeding, nerve injury and failed or incomplete block.  Patient voices understanding and wishes to proceed.  Epidural placed easily on first attempt.  No paresthesia.  Patient tolerated procedure well with no apparent complications.  Jasmine DecemberA. Sharnette Kitamura, MDReason for block:procedure for pain

## 2014-05-13 NOTE — Progress Notes (Signed)
UR completed 

## 2014-05-13 NOTE — Lactation Note (Signed)
This note was copied from the chart of Julia Talita Collier. Lactation Consultation Note Experienced BF mom. BF 1st child for 1 yr. And 2nd child 9 months, youngest child is 10 yrs. Old. Stated that she is BF w/o difficulty w/no soreness. Baby is latching well. Stated baby is eating good. Mom encouraged to feed baby 8-12 times/24 hours and with feeding cues. Reviewed Baby & Me book's Breastfeeding Basics. Encouraged to call for assistance if needed and to verify proper latch.Mom reports + breast changes w/pregnancy. [facility] brochure given w/resources, support groups and LC services. Educated about newborn behavior. All information given in Spanish and Used in house Spanish interpreter at bedside for all information.  Patient Name: Julia Collier UJWJX'BToday's Date: 05/13/2014     Maternal Data    Feeding Feeding Type: Breast Fed Length of feed: 30 min  LATCH Score/Interventions Latch: Grasps breast easily, tongue down, lips flanged, rhythmical sucking.  Audible Swallowing: A few with stimulation  Type of Nipple: Everted at rest and after stimulation  Comfort (Breast/Nipple): Soft / non-tender     Hold (Positioning): No assistance needed to correctly position infant at breast.  LATCH Score: 9  Lactation Tools Discussed/Used     Consult Status      Charyl DancerCARVER, Mar Zettler G 05/13/2014, 8:07 PM

## 2014-05-13 NOTE — Anesthesia Preprocedure Evaluation (Signed)

## 2014-05-13 NOTE — Progress Notes (Signed)
Patient ID: Julia Collier, female   DOB: 03-22-1981, 33 y.o.   MRN: [id number]  Labor Progress Note  ASSESSMENT:   Julia Collier 33 y.o. G3P2002 at 5311w4d in induced labor for PROM   PLAN:  1) Labor curve reviewed.       Progress: Appropriate, now in active labor            Plan: Continue pitocin, given contractions q 1minute, will dec pitocin from 22 --> 12 and increase as tolerated to 18 u  2) Fetal heart tracing reviewed.    Cat I, reassuring  3) GBS Status - neg No results found for this basename: STREPBCULT    4) Other Problems Active Problems:   PROM (premature rupture of membranes)   ROM (rupture of membranes), premature   SUBJECTIVE:  Pt with very painful contractions, q 1 min. Will dose fentanyl 100 mcg x 1.  OBJECTIVE:  Vital Signs:  No data found.  SVE: Dilation: 5, Effacement (%): 70, Station: -1  FHR Monitoring Baseline Rate (A): 140 bpm   Accelerations: 15 x 15 Contraction Frequency (min): 2

## 2014-05-13 NOTE — Progress Notes (Signed)
Patient ID: Julia Collier, female   DOB: [birth year], 33 y.o.   MRN: [id number]   Labor Progress Note  ASSESSMENT:   Julia Collier 33 y.o. G3P2002 at 3680w4d in active  Induced labor for PROM   PLAN:  1) Labor curve reviewed.       Progress: Appropriate, now in active labor            Plan: Continue pitocin   2) Fetal heart tracing reviewed.    Cat I, reassuring  3) GBS Status - neg No results found for this basename: STREPBCULT    4) Other Problems Active Problems:   PROM (premature rupture of membranes)   ROM (rupture of membranes), premature   SUBJECTIVE:  Pt requesting epidural.  OBJECTIVE:  Vital Signs:  Patient Vitals for the past 2 hrs:  Temp Temp src Resp  05/13/14 0254 98.1 F (36.7 C) Oral 18   SVE: Dilation: 6.5, Effacement (%): 90, Station: 0  FHR Monitoring Baseline Rate (A): 120 bpm   Accelerations: 10 x 10 Contraction Frequency (min): 2

## 2014-05-13 NOTE — Anesthesia Postprocedure Evaluation (Signed)
  Anesthesia Post-op Note  Anesthesia Post Note  Patient: Julia PoundsEdilberta Collier  Procedure(s) Performed: * No procedures listed *  Anesthesia type: Epidural  Patient location: Mother/Baby  Post pain: Pain level controlled  Post assessment: Post-op Vital signs reviewed  Last Vitals:  Filed Vitals:   05/13/14 1235  BP: 107/72  Pulse: 62  Temp: 36.8 C  Resp: 18    Post vital signs: Reviewed  Level of consciousness:alert  Complications: No apparent anesthesia complications

## 2014-05-14 LAB — CBC
HCT: 34.6 % — ABNORMAL LOW (ref 36.0–46.0)
Hemoglobin: 12.1 g/dL (ref 12.0–15.0)
MCH: 32.3 pg (ref 26.0–34.0)
MCHC: 35 g/dL (ref 30.0–36.0)
MCV: 92.3 fL (ref 78.0–100.0)
Platelets: 154 10*3/uL (ref 150–400)
RBC: 3.75 MIL/uL — ABNORMAL LOW (ref 3.87–5.11)
RDW: 12.8 % (ref 11.5–15.5)
WBC: 8.4 10*3/uL (ref 4.0–10.5)

## 2014-05-14 MED ORDER — NORETHINDRONE 0.35 MG PO TABS: 1.0000 | ORAL_TABLET | Freq: Every day | ORAL | Status: DC

## 2014-05-14 MED ORDER — IBUPROFEN 600 MG PO TABS: 600.0000 mg | ORAL_TABLET | Freq: Four times a day (QID) | ORAL | Status: AC

## 2014-05-14 NOTE — Discharge Summary (Signed)
Obstetric Discharge Summary Reason for Admission: premature rupture of membranes Prenatal Procedures: none Intrapartum Procedures: spontaneous vaginal delivery Postpartum Procedures: none Complications-Operative and Postpartum: none  Delivery Note At 5:28 AM a viable female was delivered via Vaginal, Spontaneous Delivery (Presentation: Left Occiput Anterior).  APGAR: 9, 9; weight 8 lb 12 oz (3969 g).   Placenta status: Intact, Spontaneous.  Cord: 3 vessels with the following complications: None.  Anesthesia: Epidural  Episiotomy: None Lacerations: None Suture Repair: n/a Est. Blood Loss (mL): 350  Mom to postpartum.  Baby to Couplet care / Skin to Skin.  Julia Collier 05/14/2014, 9:19 AM     Hospital Course:  Active Problems:   PROM (premature rupture of membranes)   ROM (rupture of membranes), premature   Today: No acute events overnight.  Pt denies problems with ambulating, voiding or po intake.  She denies nausea or vomiting.  Pain is well controlled.  Plan for birth control is  OCPs.  Method of Feeding: breast feeding  Julia Collier is a 33 y.o. [id number] s/p vaginal delivery after being admitted for PROM, augmented with pitocin, received epidural.  Postpartum course that was uncomplicated including no problems with ambulating, PO intake, urination, pain, or bleeding. The pt feels ready to go home and  will be discharged with outpatient follow-up.    H/H: Lab Results  Component Value Date/Time   HGB 12.1 05/14/2014  6:34 AM   HCT 34.6* 05/14/2014  6:34 AM    Discharge Diagnoses: Term Pregnancy-delivered  Discharge Information: Date: 05/14/2014  Activity: pelvic rest Diet: routine  Medications: Ibuprofen, Colace and micronor Breast feeding:  Yes Condition: stable Instructions: refer to handout Discharge to: home   Discharge Instructions   Discharge patient    Complete by:  As directed             Medication List         ibuprofen 600  MG tablet  Commonly known as:  ADVIL,MOTRIN  Take 1 tablet (600 mg total) by mouth every 6 (six) hours.     norethindrone 0.35 MG tablet  Commonly known as:  ORTHO MICRONOR  Take 1 tablet (0.35 mg total) by mouth daily.     prenatal multivitamin Tabs tablet  Take 1 tablet by mouth daily at 12 noon.         Perry MountACOSTA,Julia Collier ,MD OB Fellow 05/14/2014,9:19 AM

## 2014-05-14 NOTE — Progress Notes (Signed)
Post Partum Day 1 Subjective:  Julia Collier is a 33 y.o. [id number] s/p SVD.  No acute events overnight.  Pt denies problems with ambulating, voiding or po intake.  She  denies nausea or vomiting.  Pain is moderately controlled.  She has had flatus.  Lochia Moderate.  Plan for birth control is undecided.  Method of Feeding: breast  Objective: Blood pressure 104/67, pulse 71, temperature 97.9 F (36.6 C), temperature source Oral, resp. rate 18, height 5\' 1"  (1.549 m), weight 77.111 kg (170 lb), last menstrual period 07/31/2013, SpO2 99.00%, unknown if currently breastfeeding.  Physical Exam:  General: alert, cooperative and no distress Lochia:normal flow Chest: CTAB Heart: RRR no m/r/g Abdomen: +BS, soft, nontender,  Uterine Fundus: firm DVT Evaluation: No evidence of DVT seen on physical exam. Extremities: no edema   Recent Labs  05/12/14 1513 05/14/14 0634  HGB 14.2 12.1  HCT 39.3 34.6*    Assessment/Plan:  ASSESSMENT: Julia Collier is a 33 y.o. G3P3003 s/p TSVD, no complaints, anticipate discharge tomorrow.   LOS: 2 days   Evalyn Shultis ROCIO 05/14/2014, 8:53 AM

## 2014-05-14 NOTE — Discharge Instructions (Signed)

## 2014-08-04 ENCOUNTER — Encounter (HOSPITAL_COMMUNITY): Payer: Self-pay | Admitting: General Practice

## 2014-12-11 IMAGING — US US BREAST COMPLETE UNI LEFT INC AXILLA
1 series · 5 of 5 positions shown · non-contrast
Comparison: None.

CLINICAL DATA: 32-year-old pregnant female complaining of palpable
bilateral abnormalities.

EXAM:
ULTRASOUND OF THE BILATERAL BREAST

[Series 1: superficial breast · 5 of 5 slices shown]
[im 1/5]
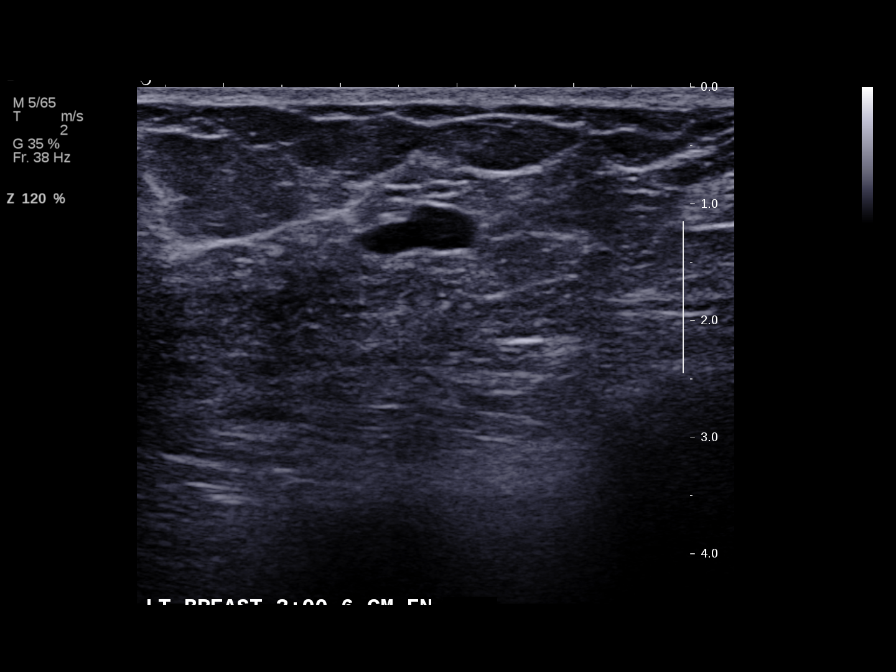
[im 2/5]
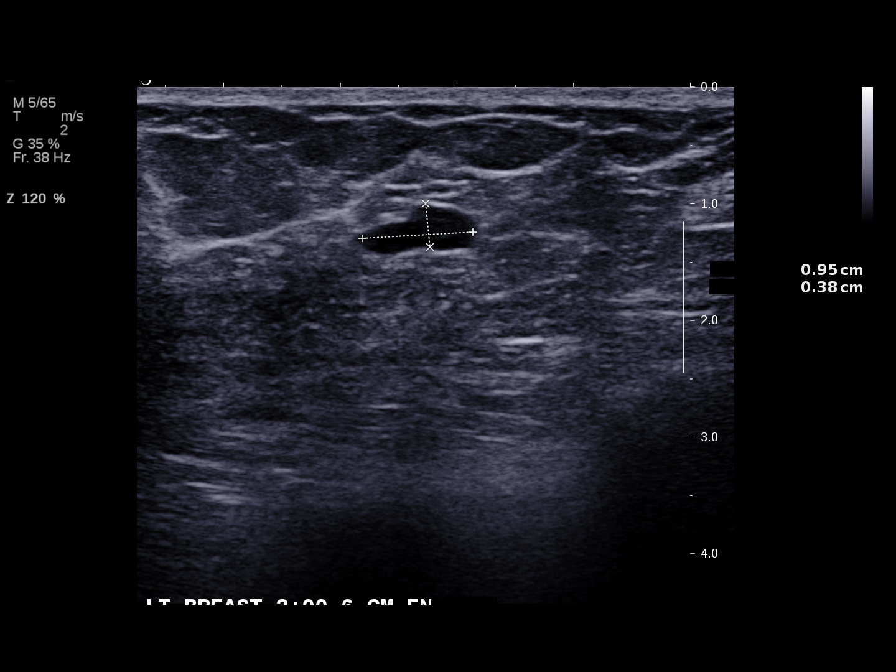
[im 3/5]
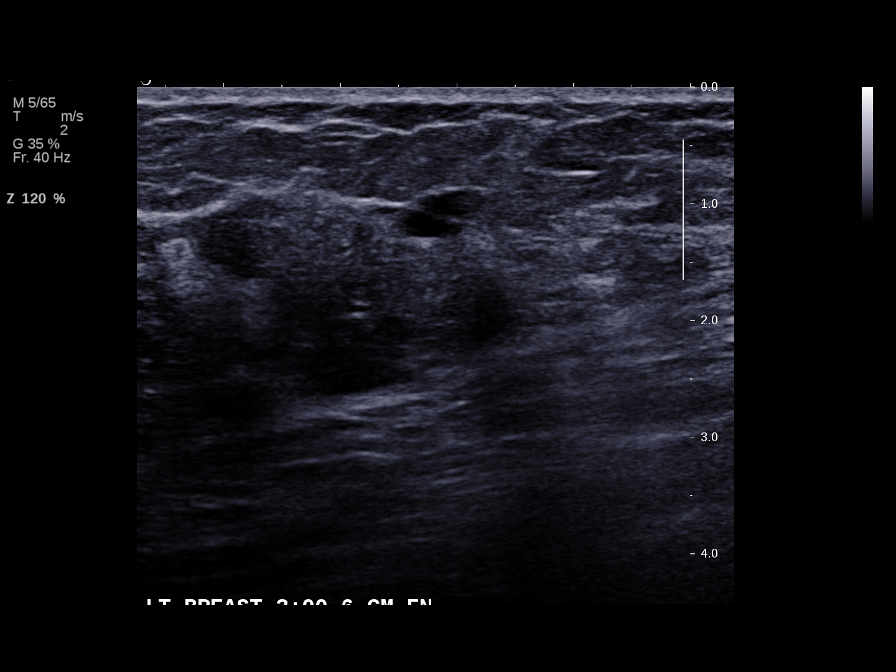
[im 4/5]
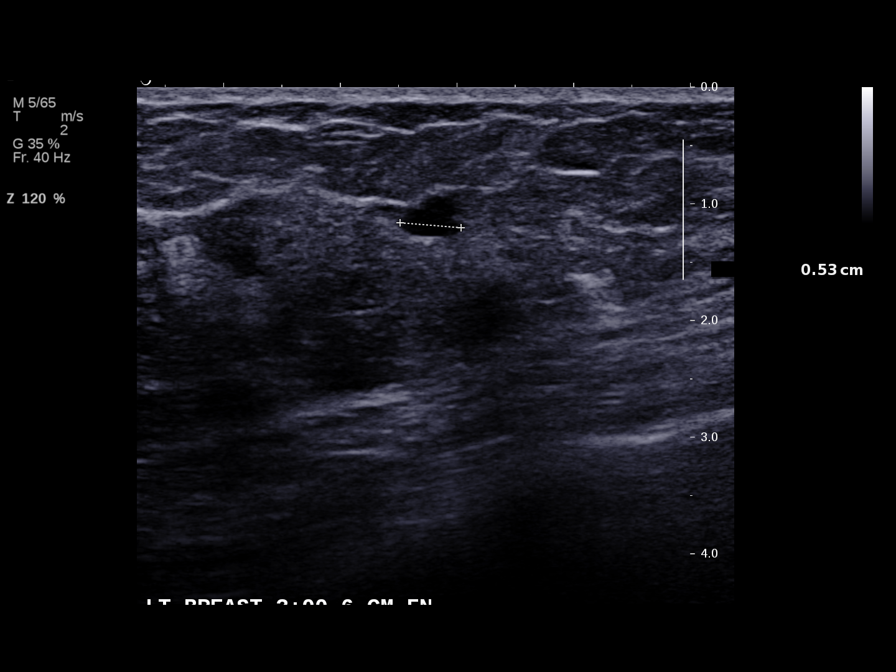
[im 5/5]
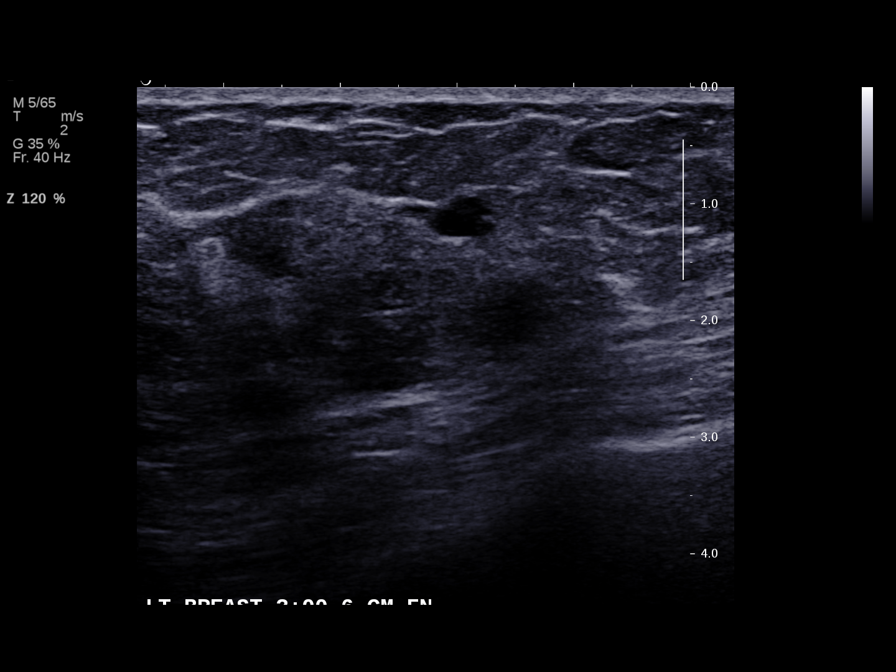

[5 of 5 positions shown; findings below may reference images not displayed]

FINDINGS: On physical exam,I do not palpate a mass in the area of clinical
concern in the lower outer quadrant and 6 o'clock region of the
right breast or the 2 o'clock region of the left breast.

Ultrasound is performed, showing normal tissue in the lower outer
quadrant and 6 o'clock region of the right breast. There is a simple
cyst in the left breast at 2 o'clock 6 cm from the nipple measuring
1.0 x 0.4 x 0.5 cm. It is well-circumscribed, anechoic with
increased through transmission.
IMPRESSION: Left breast cyst.  No evidence of malignancy in either breast.

RECOMMENDATION:
If the clinical exam remains benign/stable screening mammography
starting at the age of 40 is recommended.

I have discussed the findings and recommendations with the patient.
Results were also provided in writing at the conclusion of the
visit. If applicable, a reminder letter will be sent to the patient
regarding the next appointment.

BI-RADS CATEGORY  2: Benign Finding(s)

## 2015-02-25 IMAGING — US US OB COMP +14 WK
1 series · 12 of 28 positions shown · non-contrast
Comparison: none

[Series 1: us ob comp +14 wk · 100 acquisitions, 12 frames shown]
[im 4/100]
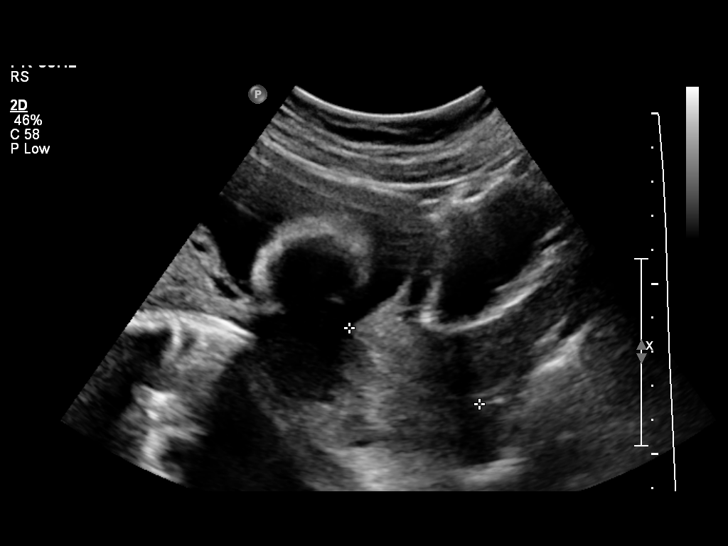
[im 12/100]
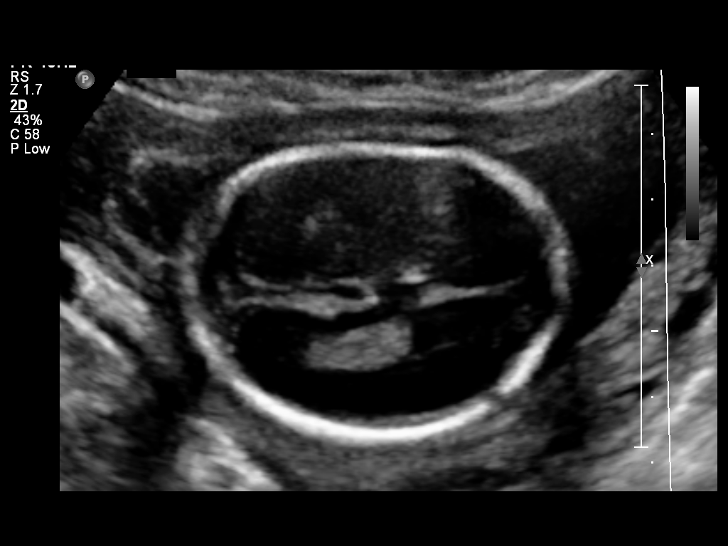
[im 19/100]
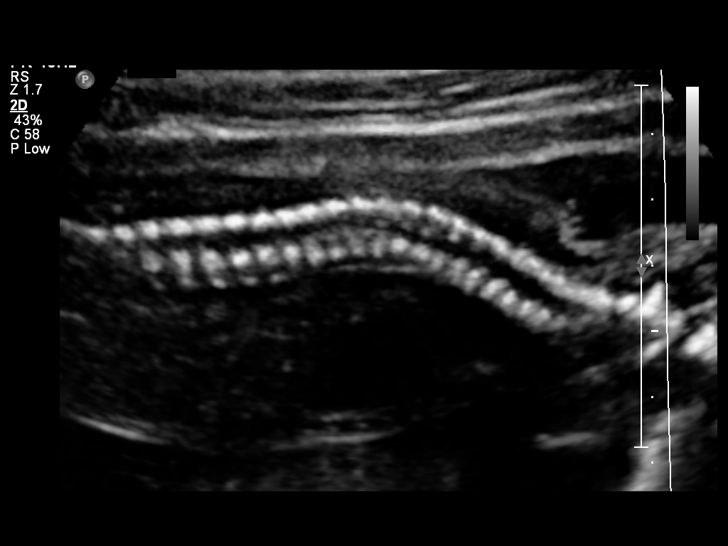
[im 30/100]
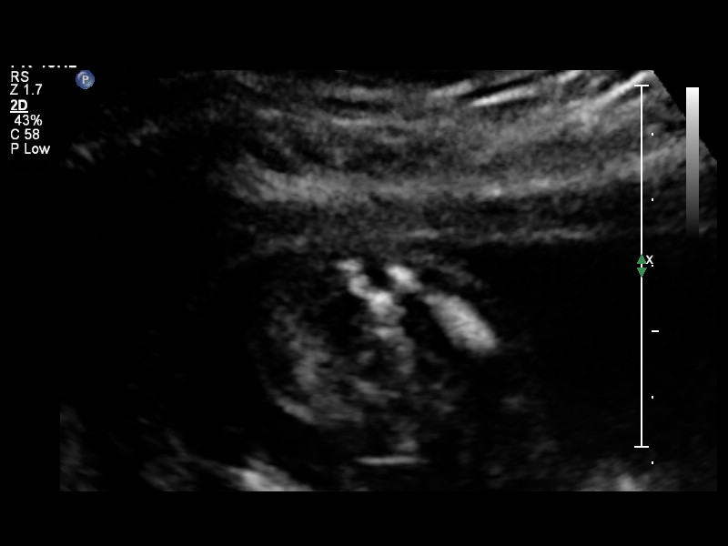
[im 37/100]
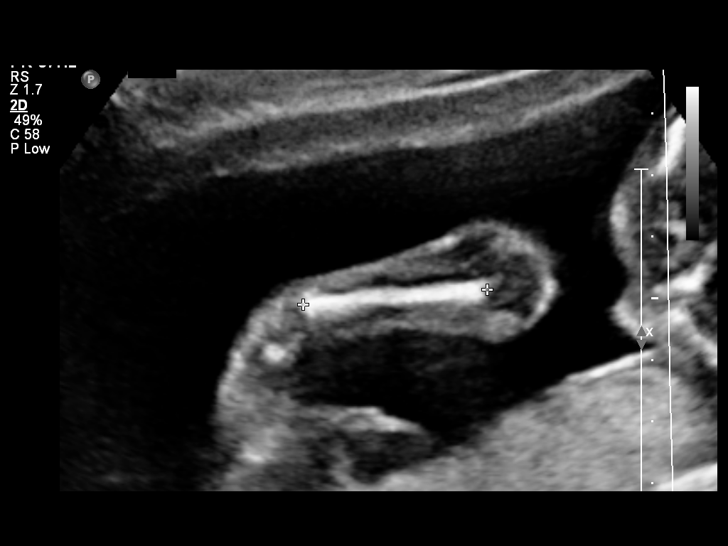
[im 45/100]
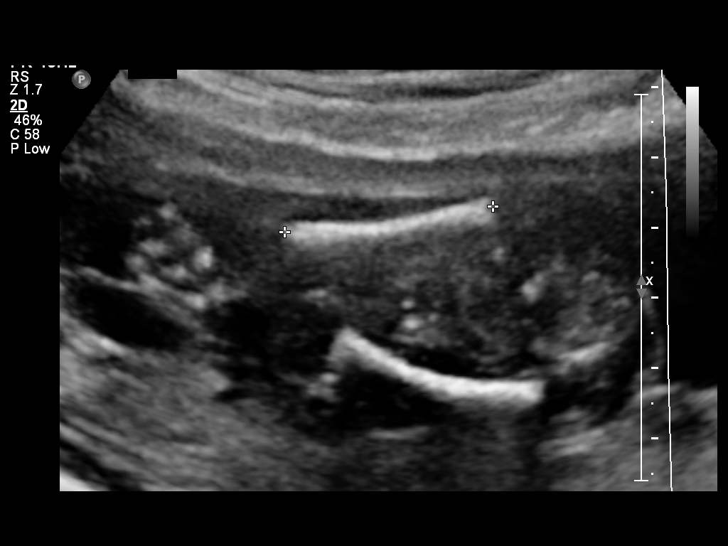
[im 56/100]
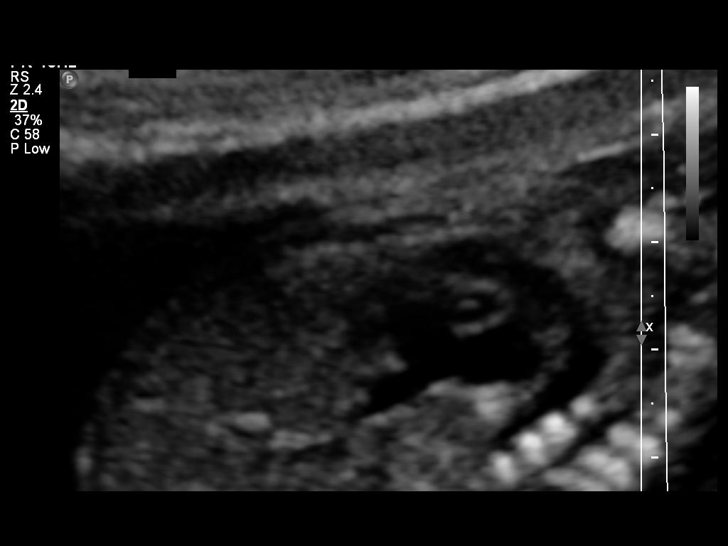
[im 63/100]
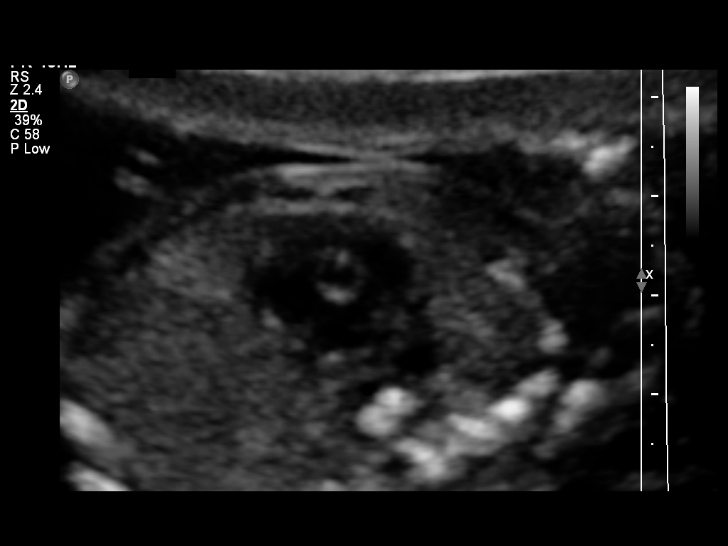
[im 70/100]
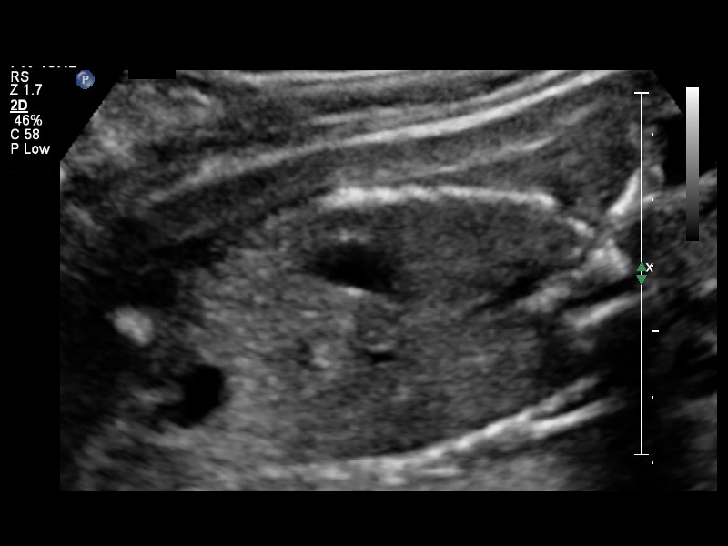
[im 81/100]
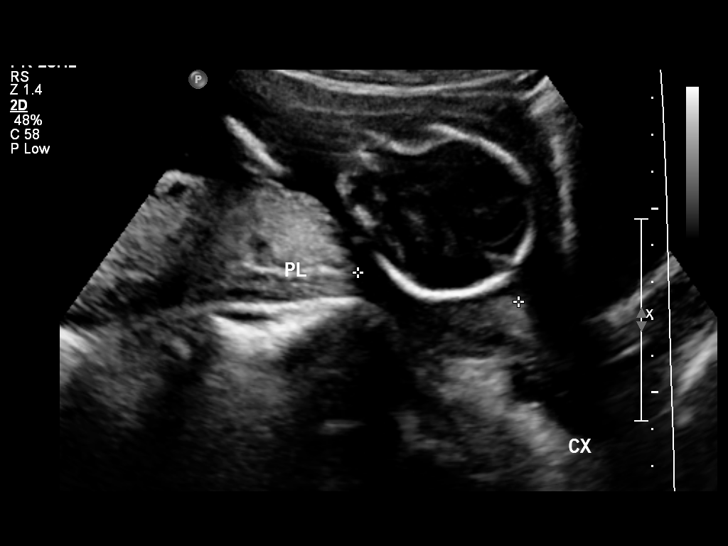
[im 89/100]
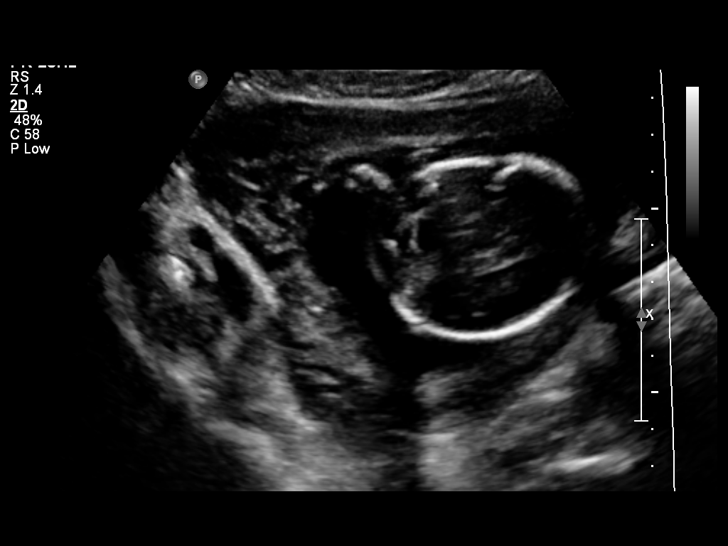
[im 96/100]
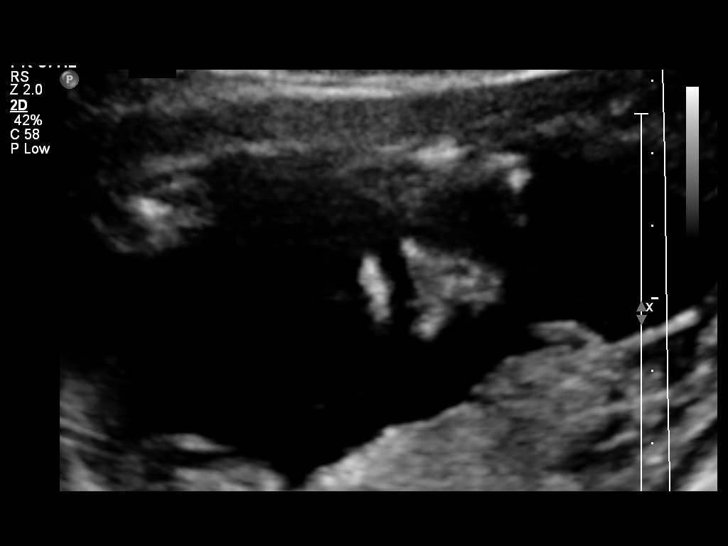

[12 of 28 positions shown; findings below may reference images not displayed]

OBSTETRICS REPORT
                      (Signed Final 12/19/2013 [DATE])

             NGAL[facility]

Service(s) Provided

 US OB COMP + 14 WK                                    76805.1
Indications

 Basic anatomic survey
Fetal Evaluation

 Num Of Fetuses:    1
 Fetal Heart Rate:  132                          bpm
 Cardiac Activity:  Observed
 Presentation:      Cephalic
 Placenta:          Posterior, above cervical
                    os
 P. Cord            Visualized
 Insertion:

 Amniotic Fluid
 AFI FV:      Subjectively within normal limits
                                             Larg Pckt:    3.88  cm
Biometry

 BPD:     43.8  mm     G. Age:  19w 2d                CI:        72.59   70 - 86
                                                      FL/HC:      18.2   16.8 -

 HC:     163.5  mm     G. Age:  19w 1d        7  %    HC/AC:      1.13   1.09 -

 AC:     144.2  mm     G. Age:  19w 5d       32  %    FL/BPD:
 FL:      29.8  mm     G. Age:  19w 2d       14  %    FL/AC:      20.7   20 - 24
 HUM:     29.8  mm     G. Age:  19w 6d       43  %

 Est. FW:     293  gm    0 lb 10 oz      35  %
Gestational Age

 LMP:           20w 1d        Date:  07/31/13                 EDD:   05/07/14
 U/S Today:     19w 3d                                        EDD:   05/12/14
 Best:          20w 1d     Det. By:  LMP  (07/31/13)          EDD:   05/07/14
Anatomy

 Cranium:          Appears normal         Aortic Arch:      Appears normal
 Fetal Cavum:      Appears normal         Ductal Arch:      Appears normal
 Ventricles:       Appears normal         Diaphragm:        Appears normal
 Choroid Plexus:   Appears normal         Stomach:          Appears normal, left
                                                            sided
 Cerebellum:       Appears normal         Abdomen:          Appears normal
 Posterior Fossa:  Appears normal         Abdominal Wall:   Appears nml (cord
                                                            insert, abd wall)
 Nuchal Fold:      Not applicable (>20    Cord Vessels:     Appears normal (3
                   wks GA)                                  vessel cord)
 Face:             Orbits appear          Kidneys:          Appear normal
                   normal
 Lips:             Appears normal         Bladder:          Appears normal
 Heart:            Appears normal         Spine:            Appears normal
                   (4CH, axis, and
                   situs)
 RVOT:             Appears normal         Lower             Appears normal
                                          Extremities:
 LVOT:             Appears normal         Upper             Appears normal
                                          Extremities:

 Other:  Fetus appears to be a male. Technically difficult due to fetal position.
Cervix Uterus Adnexa

 Cervical Length:    4.61     cm

 Cervix:       Normal appearance by transabdominal scan.
 Uterus:       No abnormality visualized.
 Cul De Sac:   No free fluid seen.
 Left Ovary:    Not visualized. No adnexal mass visualized.
 Right Ovary:   Not visualized. No adnexal mass visualized.
 Adnexa:     No abnormality visualized.
Impression

 Single IUP at 57w4d
 Normal fetal anatomic survey
 No markers associated with aneuploidy noted
 Posterior placenta without previa
 Normal amniotic fluid volume
Recommendations

 Follow-up ultrasounds as clinically indicated.

## 2017-09-18 ENCOUNTER — Encounter (HOSPITAL_COMMUNITY): Payer: Self-pay

## 2022-05-16 ENCOUNTER — Emergency Department (HOSPITAL_COMMUNITY): Payer: Self-pay

## 2022-05-16 ENCOUNTER — Encounter (HOSPITAL_COMMUNITY): Payer: Self-pay

## 2022-05-16 ENCOUNTER — Other Ambulatory Visit: Payer: Self-pay

## 2022-05-16 ENCOUNTER — Emergency Department (HOSPITAL_COMMUNITY)
Admission: EM | Admit: 2022-05-16 | Discharge: 2022-05-16 | Disposition: A | Discharge status: Home / Self Care | Payer: Self-pay | Attending: Student | Admitting: Student

## 2022-05-16 DIAGNOSIS — M25511 Pain in right shoulder: Secondary | ICD-10-CM | POA: Diagnosis not present

## 2022-05-16 DIAGNOSIS — Y92410 Unspecified street and highway as the place of occurrence of the external cause: Secondary | ICD-10-CM | POA: Diagnosis not present

## 2022-05-16 DIAGNOSIS — S39012A Strain of muscle, fascia and tendon of lower back, initial encounter: Secondary | ICD-10-CM | POA: Diagnosis not present

## 2022-05-16 DIAGNOSIS — N9489 Other specified conditions associated with female genital organs and menstrual cycle: Secondary | ICD-10-CM | POA: Insufficient documentation

## 2022-05-16 DIAGNOSIS — S3992XA Unspecified injury of lower back, initial encounter: Secondary | ICD-10-CM | POA: Diagnosis present

## 2022-05-16 LAB — I-STAT BETA HCG BLOOD, ED (MC, WL, AP ONLY): I-stat hCG, quantitative: <5 m[IU]/mL (ref <5)

## 2022-05-16 MED ORDER — KETOROLAC TROMETHAMINE 30 MG/ML IJ SOLN
30.0000 mg | Freq: Once | INTRAMUSCULAR | Status: AC
  Administered 2022-05-16: 30 mg via INTRAMUSCULAR
  Filled 2022-05-16: qty 1

## 2022-05-16 MED ORDER — NAPROXEN 500 MG PO TABS: 500.0000 mg | ORAL_TABLET | Freq: Two times a day (BID) | ORAL | 0 refills | Status: DC

## 2022-05-16 MED ORDER — METHOCARBAMOL 500 MG PO TABS: 500.0000 mg | ORAL_TABLET | Freq: Two times a day (BID) | ORAL | 0 refills | Status: DC

## 2022-05-16 NOTE — ED Notes (Signed)
Pt ambulatory without assistance.  
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
## 2022-05-17 ENCOUNTER — Ambulatory Visit: Payer: Self-pay | Admitting: *Deleted

## 2022-05-17 ENCOUNTER — Ambulatory Visit: Payer: Self-pay

## 2022-05-17 VITALS — BP 128/82 | Wt 159.1 lb

## 2022-05-17 DIAGNOSIS — N644 Mastodynia: Secondary | ICD-10-CM

## 2022-05-17 DIAGNOSIS — Z1239 Encounter for other screening for malignant neoplasm of breast: Secondary | ICD-10-CM

## 2022-05-17 DIAGNOSIS — R928 Other abnormal and inconclusive findings on diagnostic imaging of breast: Secondary | ICD-10-CM

## 2022-05-17 NOTE — Progress Notes (Signed)
[name] is a 41 y.o. female who presents to [facility] clinic today with complaint of right upper breast pain x one month that comes and goes. Patient rates the pain at a 3 out of 10. Patient had a screening mammogram completed 05/03/2022 that additional imaging of the right breast was recommended. Patient had a right breast diagnostic mammogram and ultrasound completed 05/12/2022 that a right breast ultrasound biopsy is recommended for follow up.   Pap Smear: Pap smear not completed today. Last Pap smear was 02/06/2021 at [facility] and was normal with negative HPV per patient. Per patient has no history of an abnormal Pap smear. Last Pap smear result is not available in Epic.   Physical exam: Breasts Right breast is slightly larger than left breast that per patient is normal for her. No skin abnormalities bilateral breasts. No nipple retraction bilateral breasts. No nipple discharge bilateral breasts. No lymphadenopathy. No lumps palpated bilateral breasts. No complaints of pain or tenderness on exam.      Pelvic/Bimanual Pap is not indicated today per BCCCP guidelines.   Smoking History: Patient has never smoked.   Patient Navigation: Patient education provided. Access to services provided for patient through [facility] program. Spanish interpreter Natale Lay from [facility] provided.    Breast and Cervical Cancer Risk Assessment: Patient does not have family history of breast cancer, known genetic mutations, or radiation treatment to the chest before age 4. Patient does not have history of cervical dysplasia, immunocompromised, or DES exposure in-utero.  Risk Assessment     Risk Scores       05/17/2022   Last edited by: Meryl Dare, CMA   5-year risk: 0.6 %   Lifetime risk: 7.6 %            A: BCCCP exam without pap smear Complaint of right breast pain.  P: Referred patient to [facility] for a right breast biopsy per recommendation. Appointment  scheduled Friday, May 20, 2022 at 1300.  Priscille Heidelberg, RN 05/17/2022 3:46 PM

## 2022-05-17 NOTE — Patient Instructions (Signed)
Explained breast self awareness with Julia Collier. Patient did not need a Pap smear today due to last Pap smear and HPV typing was 02/06/2021. Let her know BCCCP will cover Pap smears and HPV typing every 5 years unless has a history of abnormal Pap smears. Referred patient to [facility] for a right breast biopsy per recommendation. Appointment scheduled Friday, May 20, 2022 at 1300. Patient aware of appointment and will be there. Julia Collier verbalized understanding.  Jaydrian Corpening, Kathaleen Maser, RN 3:46 PM

## 2022-05-20 ENCOUNTER — Other Ambulatory Visit: Payer: Self-pay

## 2023-05-25 ENCOUNTER — Ambulatory Visit: Payer: Self-pay | Admitting: Hematology and Oncology

## 2023-05-25 VITALS — BP 135/98 | Wt 162.8 lb

## 2023-05-25 DIAGNOSIS — Z1239 Encounter for other screening for malignant neoplasm of breast: Secondary | ICD-10-CM

## 2023-05-25 NOTE — Patient Instructions (Signed)
Taught Julia Collier about self breast awareness and gave educational materials to take home. Patient did not need a Pap smear today due to last Pap smear was in 02/06/2022 per patient. Let her know BCCCP will cover Pap smears every 5 years unless has a history of abnormal Pap smears. Referred patient to [facility] for screening mammogram. Appointment scheduled for 05/25/23. Patient aware of appointment and will be there. Let patient know will follow up with her within the next couple weeks with results. Julia Collier verbalized understanding.  Pascal Lux, NP 1:12 PM

## 2023-05-25 NOTE — Progress Notes (Signed)
[name] is a 42 y.o. female who presents to [facility] clinic today with no complaints.    Pap Smear: Pap not smear completed today. Last Pap smear was 02/06/2022 and was normal. Per patient has no history of an abnormal Pap smear. Last Pap smear result is available in Epic.   Physical exam: Breasts Breasts symmetrical. No skin abnormalities bilateral breasts. No nipple retraction bilateral breasts. No nipple discharge bilateral breasts. No lymphadenopathy. No lumps palpated bilateral breasts.        Pelvic/Bimanual Pap is not indicated today    Smoking History: Patient has never smoked and was not referred to quit line.    Patient Navigation: Patient education provided. Access to services provided for patient through BCCCP program. Julia Collier interpreter provided. No transportation provided   Colorectal Cancer Screening: Per patient has never had colonoscopy completed No complaints today.    Breast and Cervical Cancer Risk Assessment: Patient does not have family history of breast cancer, known genetic mutations, or radiation treatment to the chest before age 102. Patient does not have history of cervical dysplasia, immunocompromised, or DES exposure in-utero.  Risk Scores as of Encounter on 05/25/2023     Julia Collier           5-year 0.64%   Lifetime 8.79%   This patient is [place] but has no documented birth country, so the [place] model used data from [place] patients to calculate their risk score. Document a birth country in the Demographics activity for a more accurate score.         Last calculated by Julia Collier, CMA on 05/25/2023 at 12:57 PM        A: BCCCP exam without pap smear No complaints with benign exam.   P: Referred patient to [facility] for a screening mammogram. Appointment scheduled 05/25/23.  Julia Collier A, NP 05/25/2023 1:10 PM

## 2024-01-04 ENCOUNTER — Encounter: Payer: Self-pay | Admitting: Nurse Practitioner

## 2024-01-04 DIAGNOSIS — R109 Unspecified abdominal pain: Secondary | ICD-10-CM

## 2024-01-12 ENCOUNTER — Other Ambulatory Visit: Payer: Self-pay | Admitting: Nurse Practitioner

## 2024-01-12 DIAGNOSIS — R109 Unspecified abdominal pain: Secondary | ICD-10-CM

## 2024-01-16 ENCOUNTER — Ambulatory Visit
Admission: RE | Admit: 2024-01-16 | Discharge: 2024-01-16 | Discharge status: Home / Self Care | Payer: Self-pay | Source: Ambulatory Visit | Attending: Nurse Practitioner | Admitting: Nurse Practitioner

## 2024-01-16 DIAGNOSIS — R109 Unspecified abdominal pain: Secondary | ICD-10-CM

## 2024-05-24 ENCOUNTER — Other Ambulatory Visit: Payer: Self-pay

## 2024-05-29 ENCOUNTER — Encounter: Payer: Self-pay | Admitting: Physician Assistant

## 2024-07-25 ENCOUNTER — Ambulatory Visit: Payer: Self-pay | Admitting: *Deleted

## 2024-07-25 VITALS — BP 116/72 | Wt 151.9 lb

## 2024-07-25 DIAGNOSIS — Z01419 Encounter for gynecological examination (general) (routine) without abnormal findings: Secondary | ICD-10-CM

## 2024-07-25 NOTE — Progress Notes (Signed)
 [name] is a 43 y.o. [id number] female who presents to [facility] clinic today with complaint of bilateral breast pain during menstrual period.    Pap Smear: Pap smear completed today.  Last Pap smear was 02/06/2021 at [facility] and was normal. Per patient has no history of an abnormal Pap smear. Last Pap smear result is available in Epic.   Physical exam: Breasts Right breast is slightly larger than left breast that per patient is normal for her. No skin abnormalities bilateral breasts. No nipple retraction bilateral breasts. No nipple discharge bilateral breasts. No lymphadenopathy. No lumps palpated bilateral breasts. No complaints of pain or tenderness on exam.       Pelvic/Bimanual Ext Genitalia No lesions, no swelling and no discharge observed on external genitalia.        Vagina Vagina pink and normal texture. No lesions or discharge observed in vagina.        Cervix Cervix is present. Cervix pink and of normal texture. No discharge observed.    Uterus Uterus is present and palpable. Uterus in normal position and normal size.        Adnexae Bilateral ovaries present and palpable. No tenderness on palpation.         Rectovaginal No rectal exam completed today since patient had no rectal complaints. No skin abnormalities observed on exam.     Smoking History: Patient has never smoked.   Patient Navigation: Patient education provided. Access to services provided for patient through [place] program. Spanish interpreter Bernice Angry from [facility] provided.   Breast and Cervical Cancer Risk Assessment: Patient does not have family history of breast cancer, known genetic mutations, or radiation treatment to the chest before age 35. Patient does not have history of cervical dysplasia, immunocompromised, or DES exposure in-utero.  Risk Scores as of Encounter on 07/25/2024     [place] as of 05/25/2023           5-year 0.64%   Lifetime 8.79%   This patient is  [place] but has no documented birth country, so the [place] model used data from [place] patients to calculate their risk score. Document a birth country in the Demographics activity for a more accurate score.         Last calculated by Silas, Ansyi K, CMA on 05/25/2023 at 12:57 PM        A: [facility] exam with pap smear Complaint of bilateral breast pain during menstrual cycle.  P: Referred patient to [facility]  for a screening mammogram. Appointment scheduled Thursday, July 25, 2024 at 1500.  Driscilla Wanda SQUIBB, RN 07/25/2024 10:36 AM

## 2024-07-25 NOTE — Patient Instructions (Signed)
 Explained breast self awareness with [name]. Pap smear completed today. Let her know [facility] will cover Pap smears and HPV typing every 5 years unless has a history of abnormal Pap smears. Referred patient to [name]  for a screening mammogram. Appointment scheduled Thursday, July 25, 2024 at 1500. Patient aware of appointment and will be there.  Julia Collier verbalized understanding.  Manly Nestle, Wanda Ship, RN 2:03 PM

## 2024-07-26 ENCOUNTER — Encounter: Payer: Self-pay | Admitting: Obstetrics and Gynecology

## 2024-07-29 ENCOUNTER — Ambulatory Visit: Payer: Self-pay | Admitting: *Deleted

## 2024-07-29 LAB — CYTOLOGY - PAP
Comment: NEGATIVE
Diagnosis: UNDETERMINED — AB
High risk HPV: NEGATIVE

## 2024-07-29 NOTE — Progress Notes (Signed)
 Pap smear due in 3 years per ASCCP guidelines.

## 2024-07-30 NOTE — Telephone Encounter (Signed)
 Called patient via [employer] [phone] to give pap smear results. Informed patient that pap smear was ASCUS and HPV was negative. Based on this result her next pap smear will be due in 3 years. Explained to patient what this result means and answered any questions that the patient had. Patient voiced understanding.
